# Patient Record
Sex: Female | Born: 1985 | Hispanic: Yes | Marital: Single | Smoking: Never smoker
Health system: Southern US, Community
[De-identification: ages and names within clinical notes are randomized; demographics above are authoritative.]

---

## 2018-11-15 ENCOUNTER — Other Ambulatory Visit: Payer: Self-pay | Admitting: Internal Medicine

## 2018-11-15 DIAGNOSIS — Z20822 Contact with and (suspected) exposure to covid-19: Secondary | ICD-10-CM

## 2018-11-16 LAB — NOVEL CORONAVIRUS, NAA: SARS-CoV-2, NAA: DETECTED — AB

## 2019-01-29 ENCOUNTER — Other Ambulatory Visit: Payer: Self-pay | Admitting: *Deleted

## 2019-01-29 DIAGNOSIS — Z20822 Contact with and (suspected) exposure to covid-19: Secondary | ICD-10-CM

## 2019-01-30 LAB — NOVEL CORONAVIRUS, NAA: SARS-CoV-2, NAA: NOT DETECTED

## 2020-04-06 ENCOUNTER — Encounter: Payer: Self-pay | Admitting: Emergency Medicine

## 2020-04-06 ENCOUNTER — Other Ambulatory Visit: Payer: Self-pay

## 2020-04-06 ENCOUNTER — Emergency Department: Payer: BC Managed Care – PPO

## 2020-04-06 ENCOUNTER — Emergency Department
Admission: EM | Admit: 2020-04-06 | Discharge: 2020-04-06 | Disposition: A | Discharge status: Home / Self Care | Payer: BC Managed Care – PPO | Attending: Emergency Medicine | Admitting: Emergency Medicine

## 2020-04-06 DIAGNOSIS — R197 Diarrhea, unspecified: Secondary | ICD-10-CM | POA: Insufficient documentation

## 2020-04-06 DIAGNOSIS — R1011 Right upper quadrant pain: Secondary | ICD-10-CM | POA: Diagnosis present

## 2020-04-06 LAB — LIPASE, BLOOD: Lipase: 36 U/L (ref 11–51)

## 2020-04-06 LAB — COMPREHENSIVE METABOLIC PANEL
ALT: 16 U/L (ref 0–44)
AST: 19 U/L (ref 15–41)
Albumin: 4.1 g/dL (ref 3.5–5.0)
Alkaline Phosphatase: 61 U/L (ref 38–126)
Anion gap: 8 (ref 5–15)
BUN: 10 mg/dL (ref 6–20)
CO2: 24 mmol/L (ref 22–32)
Calcium: 9.1 mg/dL (ref 8.9–10.3)
Chloride: 104 mmol/L (ref 98–111)
Creatinine, Ser: 0.56 mg/dL (ref 0.44–1.00)
GFR, Estimated: >60 mL/min (ref >60)
Glucose, Bld: 98 mg/dL (ref 70–99)
Potassium: 4 mmol/L (ref 3.5–5.1)
Sodium: 136 mmol/L (ref 135–145)
Total Bilirubin: 0.4 mg/dL (ref 0.3–1.2)
Total Protein: 7.5 g/dL (ref 6.5–8.1)

## 2020-04-06 LAB — URINALYSIS, COMPLETE (UACMP) WITH MICROSCOPIC
Bacteria, UA: NONE SEEN
Bilirubin Urine: NEGATIVE
Glucose, UA: NEGATIVE mg/dL
Ketones, ur: NEGATIVE mg/dL
Leukocytes,Ua: NEGATIVE
Nitrite: NEGATIVE
Protein, ur: NEGATIVE mg/dL
Specific Gravity, Urine: 1.026 (ref 1.005–1.030)
pH: 6 (ref 5.0–8.0)

## 2020-04-06 LAB — CBC
HCT: 37.8 % (ref 36.0–46.0)
Hemoglobin: 12.4 g/dL (ref 12.0–15.0)
MCH: 28.6 pg (ref 26.0–34.0)
MCHC: 32.8 g/dL (ref 30.0–36.0)
MCV: 87.1 fL (ref 80.0–100.0)
Platelets: 333 10*3/uL (ref 150–400)
RBC: 4.34 MIL/uL (ref 3.87–5.11)
RDW: 13.2 % (ref 11.5–15.5)
WBC: 7.2 10*3/uL (ref 4.0–10.5)
nRBC: 0 % (ref 0.0–0.2)

## 2020-04-06 LAB — POC URINE PREG, ED: Preg Test, Ur: NEGATIVE

## 2020-04-06 MED ORDER — PANTOPRAZOLE SODIUM 40 MG PO TBEC
40.0000 mg | DELAYED_RELEASE_TABLET | Freq: Once | ORAL | Status: AC
  Administered 2020-04-06: 40 mg via ORAL
  Filled 2020-04-06: qty 1

## 2020-04-06 MED ORDER — OXYCODONE-ACETAMINOPHEN 5-325 MG PO TABS
2.0000 | ORAL_TABLET | Freq: Once | ORAL | Status: AC
  Administered 2020-04-06: 2 via ORAL
  Filled 2020-04-06: qty 2

## 2020-04-06 MED ORDER — KETOROLAC TROMETHAMINE 30 MG/ML IJ SOLN
30.0000 mg | Freq: Once | INTRAMUSCULAR | Status: AC
  Administered 2020-04-06: 30 mg via INTRAVENOUS
  Filled 2020-04-06: qty 1

## 2020-04-06 MED ORDER — ONDANSETRON 4 MG PO TBDP
4.0000 mg | ORAL_TABLET | Freq: Once | ORAL | Status: AC
  Administered 2020-04-06: 4 mg via ORAL
  Filled 2020-04-06: qty 1

## 2020-04-06 MED ORDER — ALUM & MAG HYDROXIDE-SIMETH 200-200-20 MG/5ML PO SUSP
30.0000 mL | Freq: Once | ORAL | Status: AC
  Administered 2020-04-06: 30 mL via ORAL
  Filled 2020-04-06: qty 30

## 2020-04-06 MED ORDER — PANTOPRAZOLE SODIUM 40 MG PO TBEC: 40.0000 mg | DELAYED_RELEASE_TABLET | Freq: Every day | ORAL | 0 refills | Status: DC

## 2020-04-06 MED ORDER — HYDROMORPHONE HCL 1 MG/ML IJ SOLN
1.0000 mg | Freq: Once | INTRAMUSCULAR | Status: AC
  Administered 2020-04-06: 1 mg via INTRAVENOUS
  Filled 2020-04-06: qty 1

## 2020-04-06 MED ORDER — SUCRALFATE 1 G PO TABS
1.0000 g | ORAL_TABLET | Freq: Once | ORAL | Status: AC
  Administered 2020-04-06: 1 g via ORAL
  Filled 2020-04-06: qty 1

## 2020-04-06 NOTE — ED Notes (Signed)
[place] at bedside. Will administer pain medication when done

## 2020-04-06 NOTE — ED Provider Notes (Signed)
[facility] Emergency Department Provider Note  ____________________________________________   Event Date/Time   First MD Initiated Contact with Patient 04/06/20 1544     (approximate)  I have reviewed the triage vital signs and the nursing notes.   HISTORY  Chief Complaint Abdominal Pain and Flank Pain   HPI Charlotte Reynolds is a 35 y.o. female without significant past medical history who presents for assessment approximately 3 to 4 days of worsening right upper quadrant abdominal pain associate with some nonbloody diarrhea over the last 1 to 2 days.  She states the patient initially came and went but now seems more constant.  It does seem to get slightly worse with food she states she is 9 out of 10 in intensity pain at the moment.  She denies any headache, earache, sore throat, fevers, chills, nausea, vomiting, chest pain, cough, shortness of breath, left-sided or lower abdominal pain, urinary symptoms, blood in her stool, rash or extremity pain.  No recent falls or injuries.  She denies any significant NSAID use or EtOH use.  No prior similar episodes.  No clearly alleviating or aggravating factors.         History reviewed. No pertinent past medical history.  There are no problems to display for this patient.   History reviewed. No pertinent surgical history.  Prior to Admission medications   Medication Sig Start Date End Date Taking? Authorizing Provider  pantoprazole (PROTONIX) 40 MG tablet Take 1 tablet (40 mg total) by mouth daily for 15 days. 04/06/20 04/21/20 Yes Gilles Chiquito, MD    Allergies Sulfa antibiotics  No family history on file.  Social History Social History   Tobacco Use  . Smoking status: Never Smoker  . Smokeless tobacco: Never Used    Review of Systems  Review of Systems  Constitutional: Negative for chills and fever.  HENT: Negative for sore throat.   Eyes: Negative for pain.  Respiratory: Negative for  cough and stridor.   Cardiovascular: Negative for chest pain.  Gastrointestinal: Positive for abdominal pain and diarrhea. Negative for vomiting.  Skin: Negative for rash.  Neurological: Negative for seizures, loss of consciousness and headaches.  Psychiatric/Behavioral: Negative for suicidal ideas.  All other systems reviewed and are negative.     ____________________________________________   PHYSICAL EXAM:  VITAL SIGNS: ED Triage Vitals  Enc Vitals Group     BP 04/06/20 1445 111/72     Pulse Rate 04/06/20 1445 72     Resp 04/06/20 1445 18     Temp 04/06/20 1445 98.4 F (36.9 C)     Temp Source 04/06/20 1445 Oral     SpO2 04/06/20 1445 100 %     Weight 04/06/20 1443 150 lb (68 kg)     Height --      Head Circumference --      Peak Flow --      Pain Score 04/06/20 1442 9     Pain Loc --      Pain Edu? --      Excl. in GC? --    Vitals:   04/06/20 1445 04/06/20 1823  BP: 111/72 120/74  Pulse: 72 75  Resp: 18 16  Temp: 98.4 F (36.9 C)   SpO2: 100% 100%   Physical Exam Vitals and nursing note reviewed.  Constitutional:      General: She is not in acute distress.    Appearance: She is well-developed and well-nourished.  HENT:     Head: Normocephalic  and atraumatic.     Right Ear: External ear normal.     Left Ear: External ear normal.     Nose: Nose normal.  Eyes:     Conjunctiva/sclera: Conjunctivae normal.  Cardiovascular:     Rate and Rhythm: Normal rate and regular rhythm.     Heart sounds: No murmur heard.   Pulmonary:     Effort: Pulmonary effort is normal. No respiratory distress.     Breath sounds: Normal breath sounds.  Abdominal:     Palpations: Abdomen is soft.     Tenderness: There is abdominal tenderness in the right upper quadrant. There is no right CVA tenderness or left CVA tenderness.  Musculoskeletal:        General: No edema.     Cervical back: Neck supple.  Skin:    General: Skin is warm and dry.     Capillary Refill: Capillary  refill takes less than 2 seconds.  Neurological:     Mental Status: She is alert and oriented to person, place, and time.  Psychiatric:        Mood and Affect: Mood and affect and mood normal.     No rash or overlying skin changes over the patient's right upper quadrant. ____________________________________________   LABS (all labs ordered are listed, but only abnormal results are displayed)  Labs Reviewed  URINALYSIS, COMPLETE (UACMP) WITH MICROSCOPIC - Abnormal; Notable for the following components:      Result Value   Color, Urine YELLOW (*)    APPearance HAZY (*)    Hgb urine dipstick MODERATE (*)    All other components within normal limits  LIPASE, BLOOD  COMPREHENSIVE METABOLIC PANEL  CBC  POC URINE PREG, ED   ____________________________________________  EKG  Sinus rhythm with a ventricular rate of 67, normal axis, unremarkable intervals, no clear evidence of acute ischemia or significant underlying arrhythmia. ____________________________________________  RADIOLOGY  ED MD interpretation: Right upper quadrant ultrasound shows no evidence of cholecystitis, gallstone, or other clear acute process.  CT abdomen pelvis shows no evidence of diverticulitis, appendicitis, kidney stone, perinephric stranding, pancreatitis or any other clear acute abdominal pelvic pathology.  Official radiology report(s): CT ABDOMEN PELVIS WO CONTRAST  Result Date: 04/06/2020 CLINICAL DATA:  Lambert Mody right upper quadrant pain EXAM: CT ABDOMEN AND PELVIS WITHOUT CONTRAST TECHNIQUE: Multidetector CT imaging of the abdomen and pelvis was performed following the standard protocol without IV contrast. COMPARISON:  Ultrasound 04/06/2020 FINDINGS: Lower chest: No acute abnormality. Hepatobiliary: No focal liver abnormality is seen. No gallstones, gallbladder wall thickening, or biliary dilatation. Pancreas: Unremarkable. No pancreatic ductal dilatation or surrounding inflammatory changes. Spleen: Normal in  size without focal abnormality. Adrenals/Urinary Tract: Adrenal glands are unremarkable. Kidneys are normal, without renal calculi, focal lesion, or hydronephrosis. Bladder is unremarkable. Stomach/Bowel: Stomach is within normal limits. Appendix appears normal. No evidence of bowel wall thickening, distention, or inflammatory changes. Vascular/Lymphatic: No significant vascular findings are present. No enlarged abdominal or pelvic lymph nodes. Reproductive: IUD in the uterus.  No adnexal mass Other: No abdominal wall hernia or abnormality. No abdominopelvic ascites. Musculoskeletal: No acute or significant osseous findings. IMPRESSION: Negative. No CT evidence for acute intra-abdominal or pelvic abnormality. Electronically Signed   By: Jasmine Pang M.D.   On: 04/06/2020 18:52   US ABDOMEN LIMITED RUQ (LIVER/GB)  Result Date: 04/06/2020 CLINICAL DATA:  Right upper quadrant abdominal pain which radiates to the flank. EXAM: ULTRASOUND ABDOMEN LIMITED RIGHT UPPER QUADRANT COMPARISON:  None. FINDINGS: Gallbladder: No gallstones or wall  thickening visualized. A sonographic Eulah Pont sign is noted by sonographer. Common bile duct: Diameter: 2 mm Liver: A cyst in the right hepatic lobe measures 1.3 x 0.9 x 1.3 cm. Within normal limits in parenchymal echogenicity. Portal vein is patent on color Doppler imaging with normal direction of blood flow towards the liver. Other: None. IMPRESSION: A sonographic Eulah Pont sign is reported by the sonographer, however the gallbladder and liver are normal in appearance. Electronically Signed   By: Romona Curls M.D.   On: 04/06/2020 17:25    ____________________________________________   PROCEDURES  Procedure(s) performed (including Critical Care):  Procedures   ____________________________________________   INITIAL IMPRESSION / ASSESSMENT AND PLAN / ED COURSE      Patient presents with above to history exam for assessment of several days of right upper quadrant and  right flank pain that seems to be getting worse specially after eating any food.  Primary differential includes but is not limited to atypical presentation of ACS, cholecystitis, symptomatic cholelithiasis, pancreatitis, diverticulitis, kidney stone, pyelonephritis, PUD versus duodenal ulcer or duodenitis.  ECG shows no evidence of ischemia and overall very low suspicion for acute ACS.  Right upper quadrant ultrasound shows no evidence of gallstones or acute cholecystitis.  Lipase 36 not consistent with acute pancreatitis.  CMP shows no significant electrolyte or metabolic derangements.  No evidence of hepatitis or cholestasis.  CBC shows no leukocytosis or acute anemia.  UA shows some hemoglobin but no evidence of infection.  Urine pregnancy test is negative.  Ultrasound obtained shows no evidence of cholecystitis or any other clear acute right upper quadrant pathology.  CT abdomen pelvis shows no evidence of pyelonephritis, diverticulitis, kidney stone, or any other clear acute abdominal or pelvic pathology.  Given concern for possible PUD versus duodenal ulcer patient was given Protonix appropriate and Maalox as she had minimal change in her discomfort with opioids and Toradol.  Given stable vitals with otherwise reassuring exam and work-up I do believe she is safe for discharge plan for continued outpatient evaluation.  Discharge stable condition.  Strict precautions advised and discussed.  Rx written for Protonix.  Advise close outpatient PCP follow-up.     ____________________________________________   FINAL CLINICAL IMPRESSION(S) / ED DIAGNOSES  Final diagnoses:  RUQ pain  Right upper quadrant abdominal pain    Medications  alum & mag hydroxide-simeth (MAALOX/MYLANTA) 200-200-20 MG/5ML suspension 30 mL (has no administration in time range)  sucralfate (CARAFATE) tablet 1 g (has no administration in time range)  pantoprazole (PROTONIX) EC tablet 40 mg (has no administration in time  range)  oxyCODONE-acetaminophen (PERCOCET/ROXICET) 5-325 MG per tablet 2 tablet (2 tablets Oral Given 04/06/20 1651)  HYDROmorphone (DILAUDID) injection 1 mg (1 mg Intravenous Given 04/06/20 1818)  ketorolac (TORADOL) 30 MG/ML injection 30 mg (30 mg Intravenous Given 04/06/20 1818)  ondansetron (ZOFRAN-ODT) disintegrating tablet 4 mg (4 mg Oral Given 04/06/20 1909)     ED Discharge Orders         Ordered    pantoprazole (PROTONIX) 40 MG tablet  Daily        04/06/20 1919           Note:  This document was prepared using Dragon voice recognition software and may include unintentional dictation errors.   Gilles Chiquito, MD 04/06/20 [phone]

## 2020-04-06 NOTE — ED Triage Notes (Signed)
Pt in with sharp RUQ pain that radiates to flank x 4 days. States the pain gets worse after eating any food. Denies any cp, sob or n/v. Denies any urinary symptoms, fevers or chills. Still has gallbladder and appendix

## 2020-04-11 ENCOUNTER — Emergency Department
Admission: EM | Admit: 2020-04-11 | Discharge: 2020-04-11 | Disposition: A | Discharge status: Home / Self Care | Payer: BC Managed Care – PPO | Attending: Emergency Medicine | Admitting: Emergency Medicine

## 2020-04-11 ENCOUNTER — Emergency Department: Payer: BC Managed Care – PPO

## 2020-04-11 ENCOUNTER — Other Ambulatory Visit: Payer: Self-pay

## 2020-04-11 DIAGNOSIS — R109 Unspecified abdominal pain: Secondary | ICD-10-CM

## 2020-04-11 DIAGNOSIS — R1031 Right lower quadrant pain: Secondary | ICD-10-CM | POA: Diagnosis present

## 2020-04-11 DIAGNOSIS — N390 Urinary tract infection, site not specified: Secondary | ICD-10-CM | POA: Diagnosis not present

## 2020-04-11 LAB — HEPATIC FUNCTION PANEL
ALT: 20 U/L (ref 0–44)
AST: 29 U/L (ref 15–41)
Albumin: 4.4 g/dL (ref 3.5–5.0)
Alkaline Phosphatase: 54 U/L (ref 38–126)
Bilirubin, Direct: 0.1 mg/dL (ref 0.0–0.2)
Indirect Bilirubin: 0.9 mg/dL (ref 0.3–0.9)
Total Bilirubin: 1 mg/dL (ref 0.3–1.2)
Total Protein: 7.9 g/dL (ref 6.5–8.1)

## 2020-04-11 LAB — URINALYSIS, ROUTINE W REFLEX MICROSCOPIC
Bilirubin Urine: NEGATIVE
Glucose, UA: NEGATIVE mg/dL
Ketones, ur: NEGATIVE mg/dL
Nitrite: NEGATIVE
Protein, ur: 100 mg/dL — AB
Specific Gravity, Urine: 1.015 (ref 1.005–1.030)
Squamous Epithelial / HPF: >50 — ABNORMAL HIGH (ref 0–5)
pH: 5 (ref 5.0–8.0)

## 2020-04-11 LAB — BASIC METABOLIC PANEL
Anion gap: 9 (ref 5–15)
BUN: 7 mg/dL (ref 6–20)
CO2: 24 mmol/L (ref 22–32)
Calcium: 9 mg/dL (ref 8.9–10.3)
Chloride: 104 mmol/L (ref 98–111)
Creatinine, Ser: 0.85 mg/dL (ref 0.44–1.00)
GFR, Estimated: >60 mL/min (ref >60)
Glucose, Bld: 98 mg/dL (ref 70–99)
Potassium: 3.6 mmol/L (ref 3.5–5.1)
Sodium: 137 mmol/L (ref 135–145)

## 2020-04-11 LAB — CBC
HCT: 42.5 % (ref 36.0–46.0)
Hemoglobin: 14.2 g/dL (ref 12.0–15.0)
MCH: 29.2 pg (ref 26.0–34.0)
MCHC: 33.4 g/dL (ref 30.0–36.0)
MCV: 87.3 fL (ref 80.0–100.0)
Platelets: 356 10*3/uL (ref 150–400)
RBC: 4.87 MIL/uL (ref 3.87–5.11)
RDW: 13.1 % (ref 11.5–15.5)
WBC: 10 10*3/uL (ref 4.0–10.5)
nRBC: 0 % (ref 0.0–0.2)

## 2020-04-11 LAB — LIPASE, BLOOD: Lipase: 38 U/L (ref 11–51)

## 2020-04-11 LAB — PREGNANCY, URINE: Preg Test, Ur: NEGATIVE

## 2020-04-11 MED ORDER — HYDROCODONE-ACETAMINOPHEN 5-325 MG PO TABS: 2.0000 | ORAL_TABLET | Freq: Four times a day (QID) | ORAL | 0 refills | Status: DC | PRN

## 2020-04-11 MED ORDER — ONDANSETRON 4 MG PO TBDP: ORAL_TABLET | ORAL | 0 refills | Status: DC

## 2020-04-11 MED ORDER — KETOROLAC TROMETHAMINE 30 MG/ML IJ SOLN
15.0000 mg | Freq: Once | INTRAMUSCULAR | Status: AC
  Administered 2020-04-11: 15 mg via INTRAVENOUS
  Filled 2020-04-11: qty 1

## 2020-04-11 MED ORDER — SODIUM CHLORIDE 0.9 % IV SOLN
1.0000 g | INTRAVENOUS | Status: AC
  Administered 2020-04-11: 1 g via INTRAVENOUS
  Filled 2020-04-11: qty 10

## 2020-04-11 MED ORDER — DROPERIDOL 2.5 MG/ML IJ SOLN
2.5000 mg | Freq: Once | INTRAMUSCULAR | Status: AC
  Administered 2020-04-11: 2.5 mg via INTRAVENOUS
  Filled 2020-04-11: qty 2

## 2020-04-11 MED ORDER — CEPHALEXIN 500 MG PO CAPS: 500.0000 mg | ORAL_CAPSULE | Freq: Four times a day (QID) | ORAL | 0 refills | Status: AC

## 2020-04-11 MED ORDER — IOHEXOL 300 MG/ML  SOLN
100.0000 mL | Freq: Once | INTRAMUSCULAR | Status: AC | PRN
  Administered 2020-04-11: 100 mL via INTRAVENOUS

## 2020-04-11 NOTE — ED Notes (Signed)
Patient able to urinate. Ambulated to toilet independently.  Patient back in stretcher, appears to be more comfortable. Patient has stopped crying and rates pain lower than before. Patient educated on side effects of medication given, such as sleepiness. Patient and daughter state understanding.

## 2020-04-11 NOTE — ED Notes (Signed)
ED Provider at bedside. 

## 2020-04-11 NOTE — ED Notes (Signed)
Charlotte Reynolds daughter- [phone]

## 2020-04-11 NOTE — Discharge Instructions (Addendum)
As we discussed, your work-up was reassuring tonight, but one thing that has changed from before was that it does appear you have a urinary tract infection.  We treated you with ceftriaxone 1 g IV (IV antibiotics) and encourage you to take the full course of antibiotics prescribed.  Please also continue to take the medication prescribed previously by Dr. Katrinka Blazing on your last emergency department visit.  We provided information with how to follow-up with Dr. Norma Fredrickson who is a GI specialist that may be able to further evaluate your pain since it is not completely consistent with a urinary tract infection.  Use over-the-counter pain medicine as needed. Take Norco as prescribed for severe pain. Do not drink alcohol, drive or participate in any other potentially dangerous activities while taking this medication as it may make you sleepy. Do not take this medication with any other sedating medications, either prescription or over-the-counter. If you were prescribed Percocet or Vicodin, do not take these with acetaminophen (Tylenol) as it is already contained within these medications.   This medication is an opiate (or narcotic) pain medication and can be habit forming.  Use it as little as possible to achieve adequate pain control.  Do not use or use it with extreme caution if you have a history of opiate abuse or dependence.  If you are on a pain contract with your primary care doctor or a pain specialist, be sure to let them know you were prescribed this medication today from the [facility].  This medication is intended for your use only - do not give any to anyone else and keep it in a secure place where nobody else, especially children, have access to it.  It will also cause or worsen constipation, so you may want to consider taking an over-the-counter stool softener while you are taking this medication.    Return to the emergency department if you develop new or worsening symptoms that  concern you.

## 2020-04-11 NOTE — ED Triage Notes (Signed)
Pt presents with daughter, pt crying, appears in distress. Daughter states pt with severe right sided abd pain, has been seen here for same this week "and they didn't do anything for her". Daughter states has also seen urgent care for same without diagnosis. Pt clutching side, cyring.

## 2020-04-11 NOTE — ED Provider Notes (Signed)
[facility] Emergency Department Provider Note  ____________________________________________   Event Date/Time   First MD Initiated Contact with Patient 04/11/20 [phone]     (approximate)  I have reviewed the triage vital signs and the nursing notes.   HISTORY  Chief Complaint Abdominal Pain    HPI Charlotte Reynolds is a 35 y.o. female with no chronic medical issues who presents for evaluation of acute onset and severe sharp stabbing pain in the right side of her abdomen.  She was seen in this emergency department about 5 days ago for similar pain and had a reassuring examination with no specific cause for her symptoms.  She has also been seen at least once in urgent care including earlier today.  She is crying and cannot find a position of comfort.  She says that she cannot live like this and the pain has been ongoing since she left the hospital 5 days ago.  She said that none of the medications she was given helped and in fact nothing in particular makes the symptoms better.  She said that eating seems to make the symptoms worse.  The pain was up a little bit higher before but now it is in the mid to lower part of the right side of her abdomen and in her right flank.  She has had no associated nausea nor vomiting, and has had no dysuria nor hematuria.  She has had no recent traumas, falls, motor vehicle collisions, etc.  No numbness or tingling in her extremities.  She has no history of gallstones nor kidney stones.  She denies fever/chills, sore throat, chest pain, shortness of breath.         History reviewed. No pertinent past medical history.  There are no problems to display for this patient.   History reviewed. No pertinent surgical history.  Prior to Admission medications   Medication Sig Start Date End Date Taking? Authorizing Provider  cephALEXin (KEFLEX) 500 MG capsule Take 1 capsule (500 mg total) by mouth 4 (four) times daily for 12  days. 04/11/20 04/23/20 Yes Loleta RoseForbach, Dailyn Reith, MD  HYDROcodone-acetaminophen (NORCO/VICODIN) 5-325 MG tablet Take 2 tablets by mouth every 6 (six) hours as needed for moderate pain or severe pain. 04/11/20  Yes Loleta RoseForbach, Mita Vallo, MD  ondansetron (ZOFRAN ODT) 4 MG disintegrating tablet Allow 1-2 tablets to dissolve in your mouth every 8 hours as needed for nausea/vomiting 04/11/20  Yes Loleta RoseForbach, Nicholi Ghuman, MD  amitriptyline (ELAVIL) 25 MG tablet Take 25 mg by mouth at bedtime. 04/08/20   [provider]  dicyclomine (BENTYL) 20 MG tablet Take 20 mg by mouth 4 (four) times daily as needed for pain. 04/08/20   [provider]  pantoprazole (PROTONIX) 40 MG tablet Take 1 tablet (40 mg total) by mouth daily for 15 days. 04/06/20 04/21/20  Gilles ChiquitoSmith, Zachary P, MD    Allergies Sulfa antibiotics  History reviewed. No pertinent family history.  Social History Social History   Tobacco Use  . Smoking status: Never Smoker  . Smokeless tobacco: Never Used    Review of Systems Constitutional: No fever/chills Eyes: No visual changes. ENT: No sore throat. Cardiovascular: Denies chest pain. Respiratory: Denies shortness of breath. Gastrointestinal: Severe right-sided abdominal and flank pain as described above.  No nausea nor vomiting. Genitourinary: Negative for dysuria. Musculoskeletal: Negative for neck pain.  Negative for back pain. Integumentary: Negative for rash. Neurological: Negative for headaches, focal weakness or numbness.   ____________________________________________   PHYSICAL EXAM:  VITAL SIGNS: ED  Triage Vitals  Enc Vitals Group     BP 04/11/20 0029 (!) 127/94     Pulse Rate 04/11/20 0029 90     Resp 04/11/20 0029 16     Temp 04/11/20 0035 98.4 F (36.9 C)     Temp Source 04/11/20 0035 Oral     SpO2 04/11/20 0029 100 %     Weight 04/11/20 0030 68 kg (149 lb 14.6 oz)     Height 04/11/20 0030 1.48 m (4' 10.27")     Head Circumference --      Peak Flow --      Pain Score  04/11/20 0030 10     Pain Loc --      Pain Edu? --      Excl. in GC? --     Constitutional: Alert and oriented.  Crying, appears to be in pain. Eyes: Conjunctivae are normal.  Head: Atraumatic. Nose: No congestion/rhinnorhea. Mouth/Throat: Patient is wearing a mask. Neck: No stridor.  No meningeal signs.   Cardiovascular: Normal rate, regular rhythm. Good peripheral circulation. Respiratory: Normal respiratory effort.  No retractions. Gastrointestinal: Soft and nondistended.  Tender to palpation throughout the right side of the abdomen as well as tenderness to percussion of the right flank.  Denies epigastric tenderness and right upper quadrant tenderness.  No pulsatile masses. Musculoskeletal: No lower extremity tenderness nor edema. No gross deformities of extremities. Neurologic:  Normal speech and language. No gross focal neurologic deficits are appreciated.  Skin:  Skin is warm, dry and intact. Psychiatric: Mood and affect are agitated and in pain, angry at no specific diagnosis but obviously hurting as well as being frustrated.  ____________________________________________   LABS (all labs ordered are listed, but only abnormal results are displayed)  Labs Reviewed  URINALYSIS, ROUTINE W REFLEX MICROSCOPIC - Abnormal; Notable for the following components:      Result Value   Color, Urine YELLOW (*)    APPearance TURBID (*)    Hgb urine dipstick LARGE (*)    Protein, ur 100 (*)    Leukocytes,Ua LARGE (*)    Bacteria, UA MANY (*)    Squamous Epithelial / LPF >50 (*)    All other components within normal limits  URINE CULTURE  BASIC METABOLIC PANEL  CBC  HEPATIC FUNCTION PANEL  LIPASE, BLOOD  PREGNANCY, URINE   ____________________________________________  EKG  No indication for emergent EKG tonight.  I reviewed her EKG from 5 days ago and it was reassuring including normal QTC interval. ____________________________________________  RADIOLOGY I, Loleta Rose,  personally viewed and evaluated these images (plain radiographs) as part of my medical decision making, as well as reviewing the written report by the radiologist.  ED MD interpretation:  No acute abnormalities identified on CT abd/pelvis.  Official radiology report(s): CT ABDOMEN PELVIS W CONTRAST  Result Date: 04/11/2020 CLINICAL DATA:  Severe right-sided abdominal pain, recently assessed for similar findings EXAM: CT ABDOMEN AND PELVIS WITH CONTRAST TECHNIQUE: Multidetector CT imaging of the abdomen and pelvis was performed using the standard protocol following bolus administration of intravenous contrast. CONTRAST:  OMNIPAQUE IOHEXOL 300 MG/ML  SOLN COMPARISON:  CT 04/06/2020 FINDINGS: Lower chest: Lung bases are clear. Normal heart size. No pericardial effusion. Hepatobiliary: Few scattered subcentimeter hypoattenuating foci throughout the liver too small to fully characterize on CT imaging but statistically likely benign. Slightly larger 1.3 cm fluid attenuation cyst seen along the posteroinferior right lobe liver (2/35). No concerning renal mass. Smooth liver surface contour. Normal liver attenuation. Normal  gallbladder and biliary tree without pericholecystic fluid, inflammation or biliary ductal dilatation. No visible calcified gallstones. Pancreas: No pancreatic ductal dilatation or surrounding inflammatory changes. Spleen: Normal in size. No concerning splenic lesions. Adrenals/Urinary Tract: Normal adrenals. Few subcentimeter hypodense foci in both kidneys are incompletely characterized on this CT examination. No focal concerning renal abnormalities. Kidneys enhance and excrete uniformly. No perinephric stranding or free fluid is seen. No concerning renal mass. No urolithiasis or hydronephrosis. Urinary bladder is unremarkable. Stomach/Bowel: Distal esophagus, stomach and duodenal sweep are unremarkable. No small bowel wall thickening or dilatation. No evidence of obstruction. A normal  appendix is visualized. No colonic dilatation or wall thickening. Vascular/Lymphatic: No significant vascular findings are present. No enlarged abdominal or pelvic lymph nodes. Reproductive: Retroflexed uterus. Grossly normal positioning of the radiopaque IUD. No concerning adnexal lesions. 2.1 cm probable corpus luteum in the right adnexa. No routine follow-up imaging recommended. Note: This recommendation does not apply to premenarchal patients and to those with increased risk (genetic, family history, elevated tumor markers or other high-risk factors) of ovarian cancer. Reference: JACR 2020 Feb; 17(2):248-254 Other: Trace low-attenuation free fluid in the deep pelvis. No free air. No bowel containing hernias. Musculoskeletal: No acute osseous abnormality or suspicious osseous lesion. IMPRESSION: 1. No acute or conspicuous intra-abdominal process to provide a cause for patient's symptoms. 2. Probable corpus luteum in the right adnexa, normal physiologic finding. 3. Retroflexed uterus with expected positioning of IUD. Electronically Signed   By: Kreg Shropshire M.D.   On: 04/11/2020 03:17    ____________________________________________   PROCEDURES   Procedure(s) performed (including Critical Care):  Procedures   ____________________________________________   INITIAL IMPRESSION / MDM / ASSESSMENT AND PLAN / ED COURSE  As part of my medical decision making, I reviewed the following data within the electronic MEDICAL RECORD NUMBER History obtained from family, Nursing notes reviewed and incorporated, Labs reviewed , Old chart reviewed, Notes from prior ED visits and [place] Controlled Substance Database   Differential diagnosis includes, but is not limited to, gastric/duodenal ulcer, renal/ureteral colic, biliary disease, pancreatitis, neoplasm, SBO/ileus.  Aortic dissection or aneurysm is also possible though I think less likely.  I reviewed the medical record and saw that the patient had normal labs  including LFTs and lipase, unremarkable right upper quadrant ultrasound, and normal CT abdomen/pelvis with IV contrast when she visited the emergency department 5 days ago.  Given the possibility of ulcers, she was given a prescription for Protonix and encouraged to follow closely with her PCP.  She received Toradol, Maalox, and opioids in the emergency department, but with minimal effect.  Her vital signs are stable.  The patient is obviously in pain and is crying, is also very angry and frustrated at her lack of diagnosis and says she cannot continue living in this amount of pain.  I reviewed the [place] controlled substance database and she has no controlled substance prescription within the last 2 years and she does not have frequent visits to the emergency department.  It is clear that something new or acute is happening but the etiology is unclear, especially given the reassuring and largely negative work-up from 5 days ago.  I explained that the current plan is to repeat her lab work and to try seeing if we can get her feeling better with medications.  I will repeat a urinalysis to see if there is any indication of increased hematuria.  Her pregnancy test was -5 days ago but we will repeat that as well.  Lab work is pending.  Both she and her adult daughter who is at bedside state that the pain medicine (specifically identifying Dilaudid and morphine which were given at her last ED visit) did not help, so I will provide Toradol 15 mg IV and droperidol 2.5 mg IV which I think will help with the pain as well as a calming agent.  I reviewed her prior EKG which showed no concerning QTC prolongation.  I will consider additional imaging or repeat imaging.  The nature of her pain, the association with eating, etc., suggests biliary colic, but she had a reassuring ultrasound previously and tonight her pain is much lower in the abdomen and more suggestive of ureteral colic.  She did have a small amount of  hemoglobin in her urine previously but with no visualized stones.       Clinical Course as of 04/11/20 0434  Sun Apr 11, 2020  0111 Basic metabolic panel Normal BMP [CF]  0154 Hepatic function panel Normal hepatic function panel [CF]  0155 Lipase, blood Normal lipase [CF]  0155 CBC Normal CBC including no leukocytosis no evidence of anemia [CF]  0237 Urinalysis, Routine w reflex microscopic(!) Urinalysis appears grossly positive.  This is a significant change from her urinalysis 5 days ago.  This could suggest pyelonephritis.  I think it is very unlikely she has developed an obstructive stone or obstructive uropathy in the 5 days but given the severity of her pain in her right side of the abdomen and flank as well as a grossly positive urinalysis, I am going to repeat her CT abdomen/pelvis with IV contrast to rule out obstructive uropathy as well as to further evaluate for possible pyelonephritis.  I am treating empirically with ceftriaxone 1 g IV and a urine culture has been sent as well.  The patient is much more comfortable now.  I updated her and her daughter and they understand and agree with the plan. [CF]  0330 CT ABDOMEN PELVIS W CONTRAST Generally reassuring CT scan.  No radiographic evidence of pyelonephritis.  Patient has a corpus luteum cyst by the radiologist was not concerned by the appearance nor the size.  No other acute abnormalities including of the biliary tree or small bowel. [CF]  0410 Patient feels much better than she did before. She says that the droperidol was the only thing that she has gone recently that seems to have helped.  I advised her of the reassuring CT results and she and her daughter are comfortable with the plan for outpatient follow-up with GI as well as her primary care doctor. We will treat her empirically for urinary tract infection/pyonephritis. She knows to take the medications and follow-up with GI as recommended. I gave my usual and customary return  precautions. [CF]    Clinical Course User Index [CF] Loleta Rose, MD     ____________________________________________  FINAL CLINICAL IMPRESSION(S) / ED DIAGNOSES  Final diagnoses:  Right sided abdominal pain  Right flank pain  Urinary tract infection with hematuria, site unspecified     MEDICATIONS GIVEN DURING THIS VISIT:  Medications  droperidol (INAPSINE) 2.5 MG/ML injection 2.5 mg (2.5 mg Intravenous Given 04/11/20 0102)  ketorolac (TORADOL) 30 MG/ML injection 15 mg (15 mg Intravenous Given 04/11/20 0102)  cefTRIAXone (ROCEPHIN) 1 g in sodium chloride 0.9 % 100 mL IVPB (1 g Intravenous New Bag/Given 04/11/20 0339)  iohexol (OMNIPAQUE) 300 MG/ML solution 100 mL (100 mLs Intravenous Contrast Given 04/11/20 0258)     ED Discharge Orders  Ordered    cephALEXin (KEFLEX) 500 MG capsule  4 times daily        04/11/20 0426    HYDROcodone-acetaminophen (NORCO/VICODIN) 5-325 MG tablet  Every 6 hours PRN        04/11/20 0426    ondansetron (ZOFRAN ODT) 4 MG disintegrating tablet        04/11/20 0426          *Please note:  Charlotte Reynolds was evaluated in Emergency Department on 04/11/2020 for the symptoms described in the history of present illness. She was evaluated in the context of the global COVID-19 pandemic, which necessitated consideration that the patient might be at risk for infection with the SARS-CoV-2 virus that causes COVID-19. Institutional protocols and algorithms that pertain to the evaluation of patients at risk for COVID-19 are in a state of rapid change based on information released by regulatory bodies including the CDC and federal and state organizations. These policies and algorithms were followed during the patient's care in the ED.  Some ED evaluations and interventions may be delayed as a result of limited staffing during and after the pandemic.*  Note:  This document was prepared using Dragon voice recognition software and may include  unintentional dictation errors.   Loleta Rose, MD 04/11/20 [phone]

## 2020-04-11 NOTE — ED Notes (Signed)
Patient transported to CT 

## 2020-04-11 NOTE — ED Notes (Signed)
Patient in stretcher, daughter bedside. Patient instructed to give urine sample when she goes to the bathroom.  Patient in obvious distress, guarding abdomen and crying.

## 2020-04-11 NOTE — ED Notes (Signed)
EDP bedside explaining medications to be administered during this visit. Patient became irate, yelling at EDP. Pt states "you are not going to help me, you just give me medication to go home and you aren't going to help me".  EDP explained that patient's behavior is inappropriate. Patient tearful and allowing EDP to continue assessment.

## 2020-04-11 NOTE — ED Notes (Signed)
Patient visualized resting. NAD noted, VSS.

## 2020-04-12 LAB — URINE CULTURE
Culture: <10000 — AB
Special Requests: NORMAL

## 2020-04-21 ENCOUNTER — Ambulatory Visit: Payer: BC Managed Care – PPO | Admitting: Physician Assistant

## 2022-08-12 IMAGING — CT CT ABD-PELV W/O CM
2 of 4 series · 17 of 46 positions shown, 19 images · non-contrast
Comparison: Ultrasound 04/06/2020

CLINICAL DATA: Sharp right upper quadrant pain

EXAM:
CT ABDOMEN AND PELVIS WITHOUT CONTRAST
TECHNIQUE: Multidetector CT imaging of the abdomen and pelvis was performed
following the standard protocol without IV contrast.

[Series 2: routine abd/pel wo · axial · 0.66mm/px · z∈[-707,-297]mm · 14 of 90 slices shown, 16 images]
[im 4/90  soft-tissue]
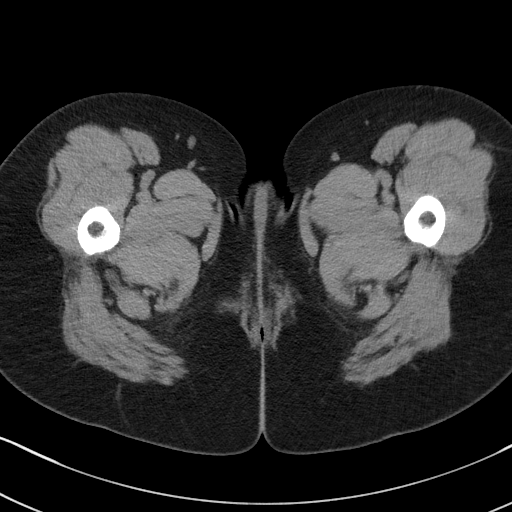
[im 4/90  bone]
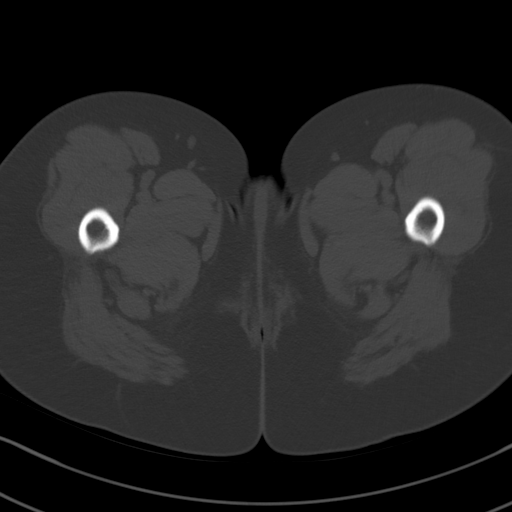
[im 12/90  soft-tissue]
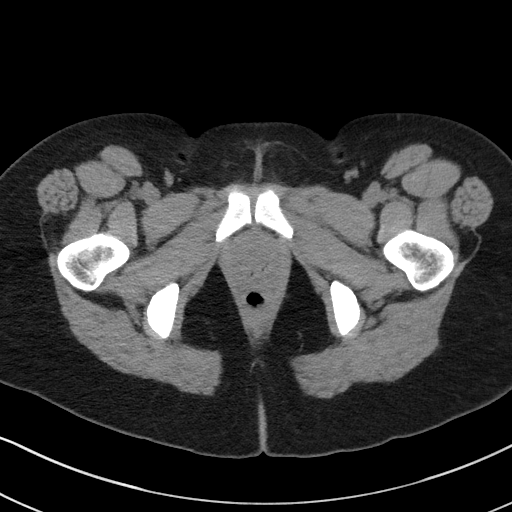
[im 19/90  soft-tissue]
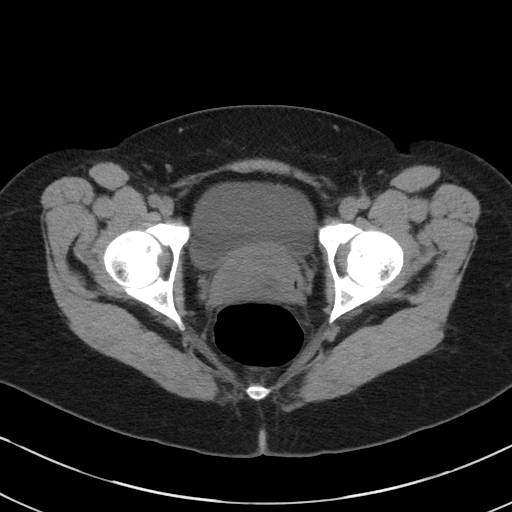
[im 23/90  soft-tissue]
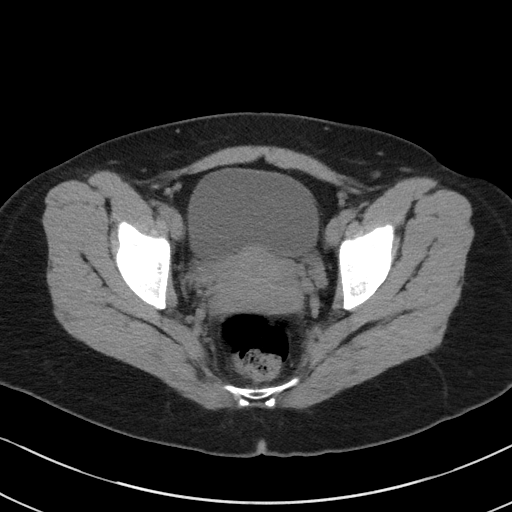
[im 30/90  soft-tissue]
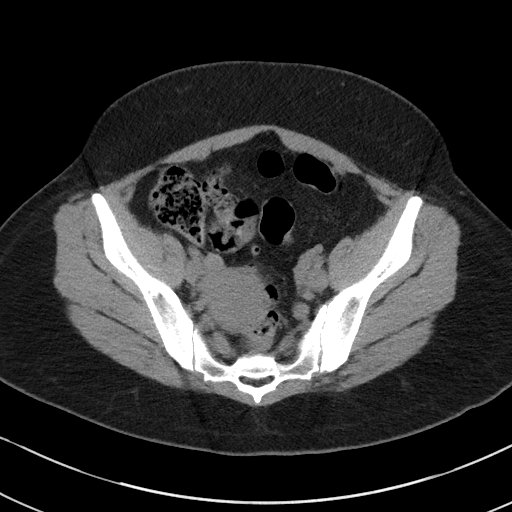
[im 38/90  soft-tissue]
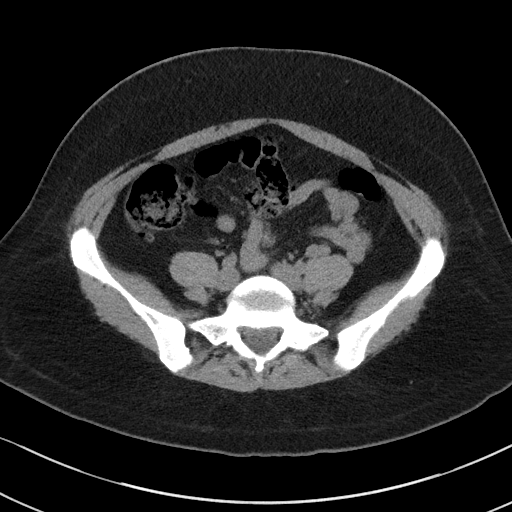
[im 41/90  soft-tissue]
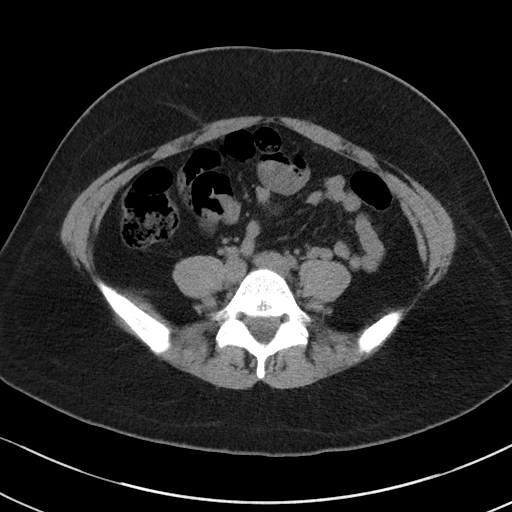
[im 49/90  soft-tissue]
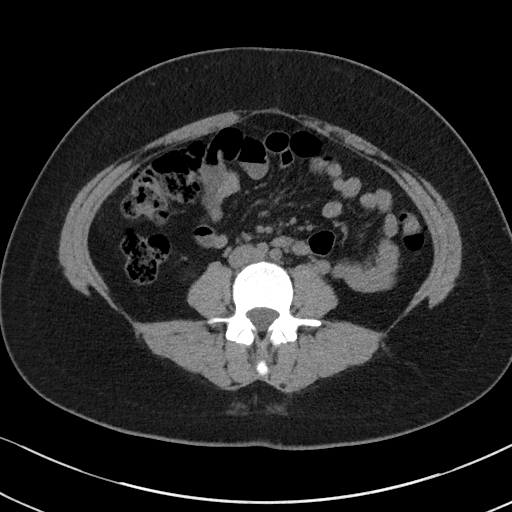
[im 52/90  soft-tissue]
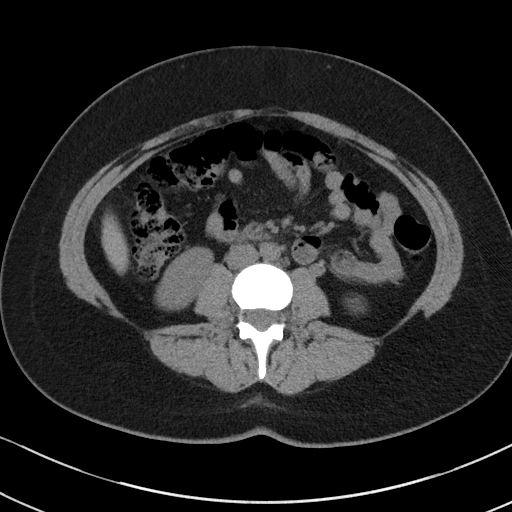
[im 52/90  bone]
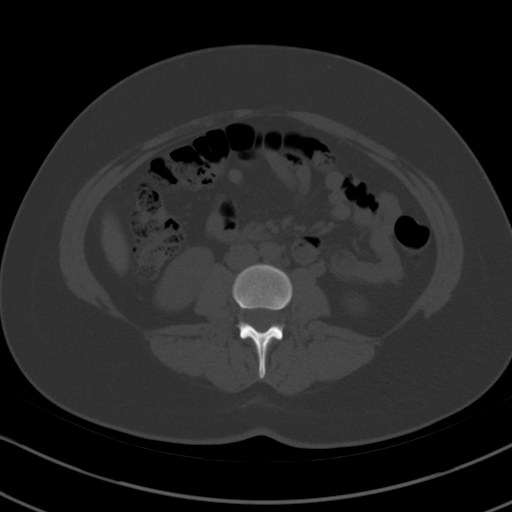
[im 60/90  soft-tissue]
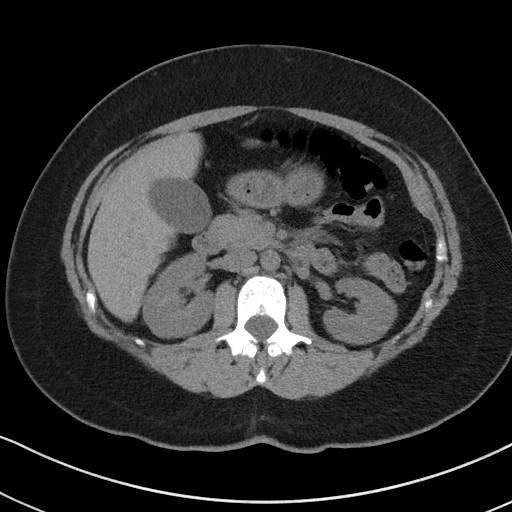
[im 67/90  soft-tissue]
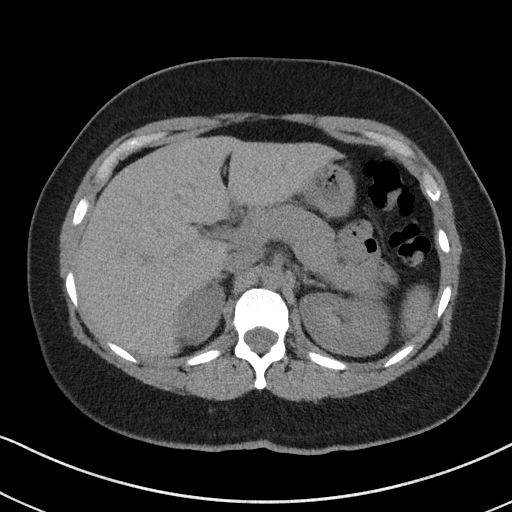
[im 71/90  soft-tissue]
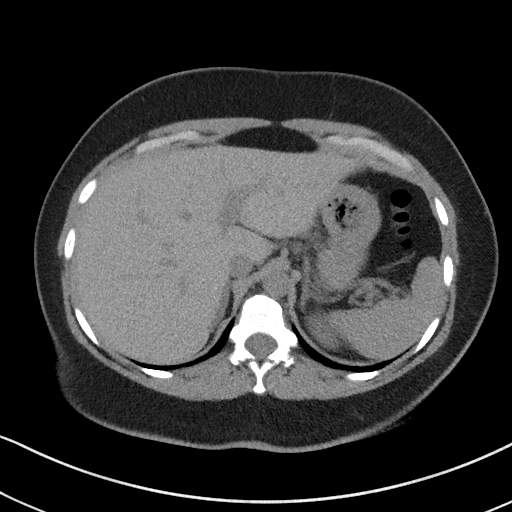
[im 78/90  soft-tissue]
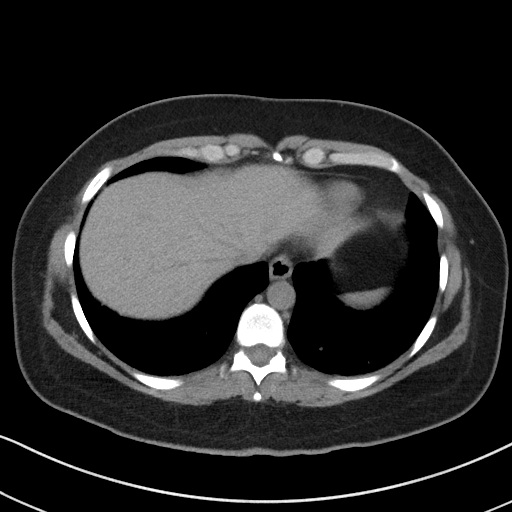
[im 86/90  soft-tissue]
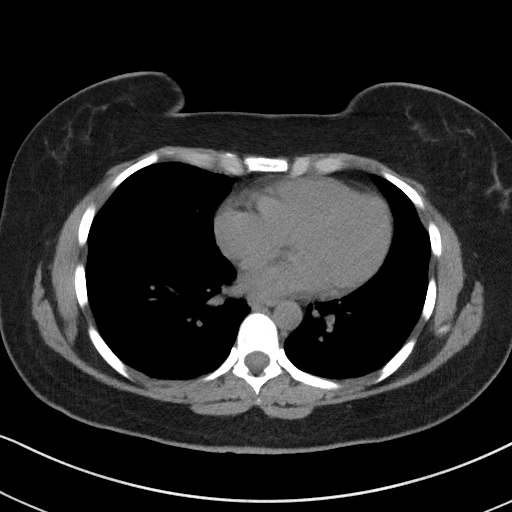

[Series 4: coronal st · coronal · 0.80mm/px · 3 of 93 slices shown]
[im 31/93  soft-tissue]
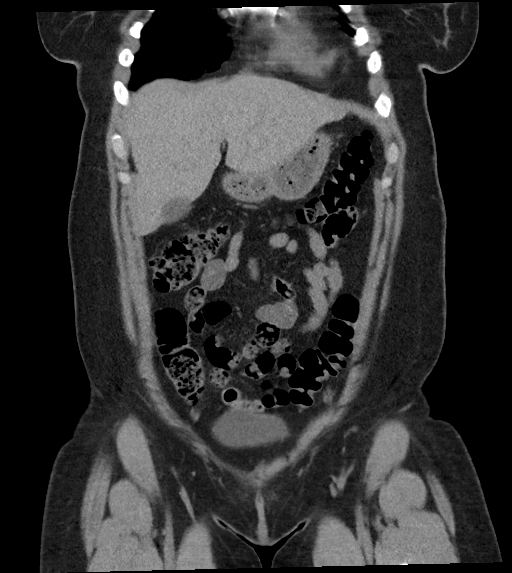
[im 41/93  soft-tissue]
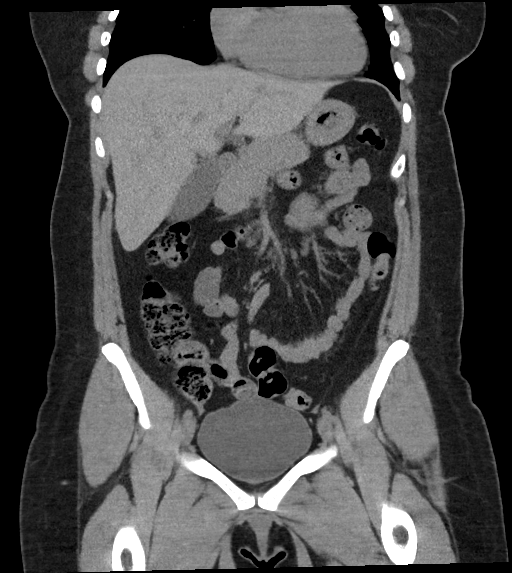
[im 52/93  soft-tissue]
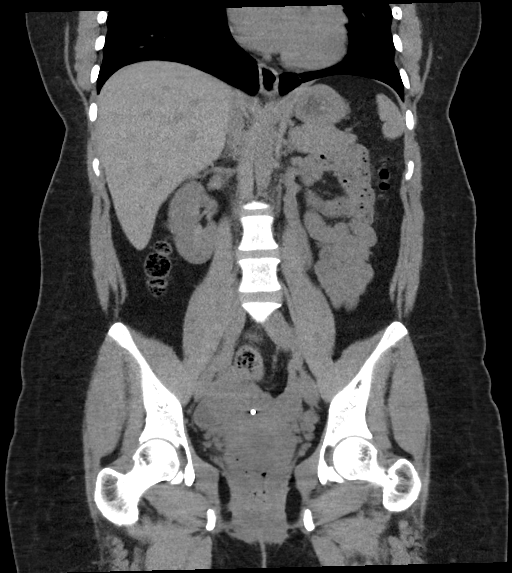

[17 of 46 positions shown; findings below may reference images not displayed]

FINDINGS: Lower chest: No acute abnormality.

Hepatobiliary: No focal liver abnormality is seen. No gallstones,
gallbladder wall thickening, or biliary dilatation.

Pancreas: Unremarkable. No pancreatic ductal dilatation or
surrounding inflammatory changes.

Spleen: Normal in size without focal abnormality.

Adrenals/Urinary Tract: Adrenal glands are unremarkable. Kidneys are
normal, without renal calculi, focal lesion, or hydronephrosis.
Bladder is unremarkable.

Stomach/Bowel: Stomach is within normal limits. Appendix appears
normal. No evidence of bowel wall thickening, distention, or
inflammatory changes.

Vascular/Lymphatic: No significant vascular findings are present. No
enlarged abdominal or pelvic lymph nodes.

Reproductive: IUD in the uterus.  No adnexal mass

Other: No abdominal wall hernia or abnormality. No abdominopelvic
ascites.

Musculoskeletal: No acute or significant osseous findings.
IMPRESSION: Negative. No CT evidence for acute intra-abdominal or pelvic
abnormality.

## 2022-11-17 ENCOUNTER — Emergency Department: Payer: 59

## 2022-11-17 ENCOUNTER — Emergency Department
Admission: EM | Admit: 2022-11-17 | Discharge: 2022-11-17 | Disposition: A | Discharge status: Home / Self Care | Payer: 59 | Attending: Emergency Medicine | Admitting: Emergency Medicine

## 2022-11-17 ENCOUNTER — Other Ambulatory Visit: Payer: Self-pay

## 2022-11-17 ENCOUNTER — Encounter: Payer: Self-pay | Admitting: Intensive Care

## 2022-11-17 DIAGNOSIS — R1031 Right lower quadrant pain: Secondary | ICD-10-CM | POA: Diagnosis present

## 2022-11-17 LAB — CBC
HCT: 38.7 % (ref 36.0–46.0)
Hemoglobin: 13 g/dL (ref 12.0–15.0)
MCH: 30 pg (ref 26.0–34.0)
MCHC: 33.6 g/dL (ref 30.0–36.0)
MCV: 89.4 fL (ref 80.0–100.0)
Platelets: 294 10*3/uL (ref 150–400)
RBC: 4.33 MIL/uL (ref 3.87–5.11)
RDW: 12.7 % (ref 11.5–15.5)
WBC: 9.8 10*3/uL (ref 4.0–10.5)
nRBC: 0 % (ref 0.0–0.2)

## 2022-11-17 LAB — COMPREHENSIVE METABOLIC PANEL
ALT: 11 U/L (ref 0–44)
AST: 14 U/L — ABNORMAL LOW (ref 15–41)
Albumin: 3.8 g/dL (ref 3.5–5.0)
Alkaline Phosphatase: 58 U/L (ref 38–126)
Anion gap: 7 (ref 5–15)
BUN: 9 mg/dL (ref 6–20)
CO2: 24 mmol/L (ref 22–32)
Calcium: 8.9 mg/dL (ref 8.9–10.3)
Chloride: 105 mmol/L (ref 98–111)
Creatinine, Ser: 0.64 mg/dL (ref 0.44–1.00)
GFR, Estimated: >60 mL/min (ref >60)
Glucose, Bld: 85 mg/dL (ref 70–99)
Potassium: 3.7 mmol/L (ref 3.5–5.1)
Sodium: 136 mmol/L (ref 135–145)
Total Bilirubin: 1 mg/dL (ref 0.3–1.2)
Total Protein: 7.1 g/dL (ref 6.5–8.1)

## 2022-11-17 LAB — URINALYSIS, ROUTINE W REFLEX MICROSCOPIC
Bilirubin Urine: NEGATIVE
Glucose, UA: NEGATIVE mg/dL
Ketones, ur: NEGATIVE mg/dL
Leukocytes,Ua: NEGATIVE
Nitrite: NEGATIVE
Protein, ur: NEGATIVE mg/dL
Specific Gravity, Urine: 1.009 (ref 1.005–1.030)
pH: 6 (ref 5.0–8.0)

## 2022-11-17 LAB — LIPASE, BLOOD: Lipase: 31 U/L (ref 11–51)

## 2022-11-17 LAB — POC URINE PREG, ED: Preg Test, Ur: NEGATIVE

## 2022-11-17 MED ORDER — HALOPERIDOL LACTATE 5 MG/ML IJ SOLN
2.5000 mg | Freq: Once | INTRAMUSCULAR | Status: AC
  Administered 2022-11-17: 2.5 mg via INTRAVENOUS
  Filled 2022-11-17: qty 1

## 2022-11-17 MED ORDER — IOHEXOL 300 MG/ML  SOLN
80.0000 mL | Freq: Once | INTRAMUSCULAR | Status: AC | PRN
  Administered 2022-11-17: 80 mL via INTRAVENOUS

## 2022-11-17 MED ORDER — KETOROLAC TROMETHAMINE 15 MG/ML IJ SOLN
15.0000 mg | Freq: Once | INTRAMUSCULAR | Status: AC
  Administered 2022-11-17: 15 mg via INTRAVENOUS
  Filled 2022-11-17: qty 1

## 2022-11-17 MED ORDER — DICYCLOMINE HCL 10 MG PO CAPS: 10.0000 mg | ORAL_CAPSULE | Freq: Three times a day (TID) | ORAL | 0 refills | Status: DC | PRN

## 2022-11-17 NOTE — ED Triage Notes (Signed)
From [facility].  Rlq pain since yesterday am when she was working out.  They did cbc and said wnl.  Says she was tender to palpation

## 2022-11-17 NOTE — Discharge Instructions (Addendum)
Please seek medical attention for any high fevers, chest pain, shortness of breath, change in behavior, persistent vomiting, bloody stool or any other new or concerning symptoms.  

## 2022-11-17 NOTE — ED Provider Notes (Signed)
[facility] Provider Note    Event Date/Time   First MD Initiated Contact with Patient 11/17/22 [phone]     (approximate)   History   Abdominal Pain   HPI Britteny Ima Hafner is a 37 y.o. female presenting today for right lower quadrant abdominal pain.  Patient states pain in her right lower quadrant started 2 days ago around the time she was working out.  She has had progressive worsening of the pain.  Notices it with bending over or twisting but it is also present at rest now.  Tenderness to palpation in that area.  Denies fever, chills, nausea, vomiting, diarrhea, constipation, dysuria, vaginal bleeding or discharge.  No prior abdominal surgeries.     Physical Exam   Triage Vital Signs: ED Triage Vitals  Encounter Vitals Group     BP 11/17/22 0950 109/73     Systolic BP Percentile --      Diastolic BP Percentile --      Pulse Rate 11/17/22 0950 85     Resp 11/17/22 0950 18     Temp 11/17/22 0950 98.8 F (37.1 C)     Temp Source 11/17/22 0950 Oral     SpO2 11/17/22 0950 98 %     Weight 11/17/22 0945 152 lb 3.2 oz (69 kg)     Height 11/17/22 0946 4\' 10"  (1.473 m)     Head Circumference --      Peak Flow --      Pain Score 11/17/22 0946 7     Pain Loc --      Pain Education --      Exclude from Growth Chart --     Most recent vital signs: Vitals:   11/17/22 1000 11/17/22 1708  BP: 111/72 108/67  Pulse: 73 87  Resp:  18  Temp:  98.3 F (36.8 C)  SpO2: 100% 100%   Physical Exam: I have reviewed the vital signs and nursing notes. General: Awake, alert, no acute distress.  Nontoxic appearing. Head:  Atraumatic, normocephalic.   ENT:  EOM intact, PERRL. Oral mucosa is pink and moist with no lesions. Neck: Neck is supple with full range of motion, No meningeal signs. Cardiovascular:  RRR, No murmurs. Peripheral pulses palpable and equal bilaterally. Respiratory:  Symmetrical chest wall expansion.  No rhonchi, rales, or wheezes.  Good  air movement throughout.  No use of accessory muscles.   Musculoskeletal:  No cyanosis or edema. Moving extremities with full ROM Abdomen:  Soft, tender to palpation right lower quadrant, nondistended. Neuro:  GCS 15, moving all four extremities, interacting appropriately. Speech clear. Psych:  Calm, appropriate.   Skin:  Warm, dry, no rash.    ED Results / Procedures / Treatments   Labs (all labs ordered are listed, but only abnormal results are displayed) Labs Reviewed  COMPREHENSIVE METABOLIC PANEL - Abnormal; Notable for the following components:      Result Value   AST 14 (*)    All other components within normal limits  URINALYSIS, ROUTINE W REFLEX MICROSCOPIC - Abnormal; Notable for the following components:   Color, Urine STRAW (*)    APPearance CLEAR (*)    Hgb urine dipstick MODERATE (*)    Bacteria, UA RARE (*)    All other components within normal limits  LIPASE, BLOOD  CBC  POC URINE PREG, ED     EKG    RADIOLOGY Independently interpreted CT abdomen/pelvis along with radiology read indicating no obvious appendicitis.  Slight  evidence of colitis around the cecum but no other acute pathology.  Transvaginal ultrasound imaging pending at time of signout   PROCEDURES:  Critical Care performed: No  Procedures   MEDICATIONS ORDERED IN ED: Medications  iohexol (OMNIPAQUE) 300 MG/ML solution 80 mL (80 mLs Intravenous Contrast Given 11/17/22 1056)  ketorolac (TORADOL) 15 MG/ML injection 15 mg (15 mg Intravenous Given 11/17/22 1159)  haloperidol lactate (HALDOL) injection 2.5 mg (2.5 mg Intravenous Given 11/17/22 1614)     IMPRESSION / MDM / ASSESSMENT AND PLAN / ED COURSE  I reviewed the triage vital signs and the nursing notes.                              Differential diagnosis includes, but is not limited to, appendicitis, ovarian cyst, ovarian torsion, nephrolithiasis, cystitis.  Patient's presentation is most consistent with acute complicated illness  / injury requiring diagnostic workup.  Patient is a 37 year old female presenting for 2 days of right lower quadrant abdominal pain.  Vital signs stable on arrival.  Initial concern for appendicitis.  Laboratory workup at this time overall reassuring with no leukocytosis, normal lipase, and unremarkable CMP.  UA shows no evidence of UTI.  CT abdomen/pelvis was ordered to evaluate for possible appendicitis.  No evidence of appendicitis but did show some colitis around the cecum.  Separately, noted follicles around the right ovary but unable to fully differentiate.  Follow-up transvaginal ultrasound was ordered to rule out ovarian etiology of pain symptoms.  Patient was given 1 dose of Toradol with improvement of pain symptoms.  Patient signed out to oncoming provider while awaiting results of transvaginal ultrasound.  Suspect if this is negative, then patient can be discharged with supportive care.  The patient is on the cardiac monitor to evaluate for evidence of arrhythmia and/or significant heart rate changes. Clinical Course as of 11/18/22 1110  Fri Nov 17, 2022  1042 Comprehensive metabolic panel(!) [DW]  1042 Preg Test, Ur: Negative Unremarkable [DW]  1043 Lipase: 31 [DW]  1110 Urinalysis, Routine w reflex microscopic -Urine, Clean Catch(!) No UTI but some evidence of blood which could possibly indicate stone.  Will evaluate CT abdomen/pelvis [DW]    Clinical Course User Index [DW] Janith Lima, MD     FINAL CLINICAL IMPRESSION(S) / ED DIAGNOSES   Final diagnoses:  Right lower quadrant abdominal pain     Rx / DC Orders   ED Discharge Orders          Ordered    dicyclomine (BENTYL) 10 MG capsule  3 times daily PRN        11/17/22 1723             Note:  This document was prepared using Dragon voice recognition software and may include unintentional dictation errors.   Janith Lima, MD 11/18/22 1110

## 2022-11-17 NOTE — ED Provider Notes (Signed)
[place] without clear etiology of the pain, does show simple cyst. Patient was given haldol given concern for some inflammation around the cecum. Patient did  have good relief after the pain medication. At this time given that patient felt improved will plan on discharging. Will give patient both GI and Ob/gyn follow up.   Phineas Semen, MD 11/17/22 [phone]

## 2023-01-08 ENCOUNTER — Ambulatory Visit: Payer: 59 | Admitting: Gastroenterology

## 2023-01-08 ENCOUNTER — Encounter: Payer: Self-pay | Admitting: Gastroenterology

## 2023-01-08 ENCOUNTER — Telehealth: Payer: Self-pay

## 2023-01-08 VITALS — BP 120/84 | HR 69 | Temp 97.9°F | Ht <= 58 in | Wt 146.2 lb

## 2023-01-08 DIAGNOSIS — R933 Abnormal findings on diagnostic imaging of other parts of digestive tract: Secondary | ICD-10-CM

## 2023-01-08 DIAGNOSIS — R1031 Right lower quadrant pain: Secondary | ICD-10-CM

## 2023-01-08 DIAGNOSIS — R319 Hematuria, unspecified: Secondary | ICD-10-CM | POA: Diagnosis not present

## 2023-01-08 NOTE — Progress Notes (Signed)
Wyline Mood MD, MRCP(U.K) [address]  [address]  [place], [place] [address]  Main: [phone]  Fax: [phone]   Gastroenterology Consultation  Referring Provider:     No ref. provider found Primary Care Physician:  Patient, No Pcp Per Primary Gastroenterologist:  Dr. Wyline Mood  Reason for Consultation:     RLQ Pain - ER visit         HPI:   Charlotte Reynolds is a 37 y.o. y/o female  was seen at the ER on 11/17/2022 for RLQ pain of 2 days duration.   In the ER she had a CT scan of the abdomen that showed  New thickening of the right lateral conal fascia in the right lower quadrant adjacent to the cecum with subtle fat stranding and probable trace amount of fluid. There is also a new slightly prominent hyperattenuating pericecal lymph node measuring 6 x 8 mm. Findings are nonspecific; however, subacute/mild degree of underlying cecal inflammation can not be excluded on this exam.Also 3.1 cm right adnexa abnormality noted. ? Dominant follicle , ventral hernia  She had a USG pelvis also done : normal .   Hb 13 grams, CMP,Lipase- normal - moderate to large Hb in urine  Seen by GYN on 11/23/2022 : Plan was for GI evaluation before next steps from them.   She states that she used to live in [place] but moved to the [place] 5 years back.  She has had right lower quadrant pain ongoing for a few months not really changed.  The pain usually begins prior to a bowel movement usually after a meal and subsequently has diarrhea and relieved after the meal.  This only occurs when she eats food outside her home.  When she eats food cooked at home her bowel movements are formed.  Denies any NSAID use.  Denies any fevers night sweats.  Denies any family history of colon cancer, tuberculosis.  No blood in the stool.  Does have some pain in the same area during her.'s but no pain during sexual intercourse.  No other aggravating or relieving factors.  No past medical history on  file.  No past surgical history on file.  Prior to Admission medications   Medication Sig Start Date End Date Taking? Authorizing Provider  amitriptyline (ELAVIL) 25 MG tablet Take 25 mg by mouth at bedtime. 04/08/20   [provider]  dicyclomine (BENTYL) 10 MG capsule Take 1 capsule (10 mg total) by mouth 3 (three) times daily as needed (abdominal pain). 11/17/22   Phineas Semen, MD  dicyclomine (BENTYL) 20 MG tablet Take 20 mg by mouth 4 (four) times daily as needed for pain. 04/08/20   [provider]  HYDROcodone-acetaminophen (NORCO/VICODIN) 5-325 MG tablet Take 2 tablets by mouth every 6 (six) hours as needed for moderate pain or severe pain. 04/11/20   Loleta Rose, MD  ondansetron (ZOFRAN ODT) 4 MG disintegrating tablet Allow 1-2 tablets to dissolve in your mouth every 8 hours as needed for nausea/vomiting 04/11/20   Loleta Rose, MD  pantoprazole (PROTONIX) 40 MG tablet Take 1 tablet (40 mg total) by mouth daily for 15 days. 04/06/20 04/21/20  Gilles Chiquito, MD    No family history on file.   Social History   Tobacco Use   Smoking status: Never   Smokeless tobacco: Never  Vaping Use   Vaping status: Never Used  Substance Use Topics   Alcohol use: Never   Drug use: Never  Allergies as of 01/08/2023 - Review Complete 01/08/2023  Allergen Reaction Noted   Other Shortness Of Breath and Swelling 11/17/2022   Sulfa antibiotics Rash 04/06/2020    Review of Systems:    All systems reviewed and negative except where noted in HPI.   Physical Exam:  BP 120/84   Pulse 69   Temp 97.9 F (36.6 C) (Oral)   Ht 4\' 9"  (1.448 m)   Wt 146 lb 3.2 oz (66.3 kg)   BMI 31.64 kg/m  No LMP recorded. Psych:  Alert and cooperative. Normal mood and affect. General:   Alert,  Well-developed, well-nourished, pleasant and cooperative in NAD Head:  Normocephalic and atraumatic. Eyes:  Sclera clear, no icterus.   Conjunctiva pink. Ears:  Normal auditory acuity. Abdomen:   Normal bowel sounds.  No bruits.  Soft, very mild tenderness in the right lower quadrant and non-distended without masses, hepatosplenomegaly or hernias noted.  No guarding or rebound tenderness.    Neurologic:  Alert and oriented x3;  grossly normal neurologically. Psych:  Alert and cooperative. Normal mood and affect.  Imaging Studies: No results found.  Assessment and Plan:   Charlotte Reynolds is a 37 y.o. y/o female here after an ER visit for RLQ pain. CT showed non specific cecal inflammatory findings which may have been just from acute colitis from an infection vs longer stading issues such as infections from entaemoba/TB since she is from [place] , vs IBD. Also noted incidental finding of blood in the urine.   Plan  Recheck urine will be performed at her next visit since she is presently on her period CT scan of the abdomen to evaluate the status of the inflammation.  Depending on the results may need to consider MR enterography versus colonoscopy with ileal intubation and biopsies to rule out infection and inflammation.  Follow up in 6 weeks  Dr Wyline Mood MD,MRCP(U.K)  \

## 2023-01-08 NOTE — Telephone Encounter (Signed)
Patient CT scan was approved but she has been approved at [facility] in [place] not [facility]. Her insurance would not approved [facility] location. Can you please change location.   Summary of Your Request Please review the details of your request below and if everything looks correct click CONTINUE  Your case has been Approved. The prior authorization you submitted, Case [id number], has been received. Additional case status notifications will be sent if you opted in for email notifications. Thank you.   Provider Name: Wyline Mood Contact: [facility] Provider Address: [address] [place], [place] [address] Phone Number: [phone]  Fax Number: [phone] Patient Name: Charlotte Reynolds Patient Id: [id number] Insurance Carrier: [employer] Site Name: [facility] Site ID: [id number] Site Address: [address] [place], [place] [address]     Primary Diagnosis Code: R93.3 Description: Abnormal findings on diagnostic imaging of other parts of digestive tract Secondary Diagnosis Code: R10.31 Description: Right lower quadrant pain Date of Service: Not provided   CPT Code: 74177 Description: CT ABDOMEN & PELVIS W/ Authorization Number: [id number] Case Number: [id number] Review Date: 01/08/2023 4:13:20 PM Expiration Date: 07/07/2023 Status: Your case has been Approved. The prior authorization you submitted, Case [id number], has been received. Additional case status notifications will be sent if you opted in for email notifications. Thank you.

## 2023-01-08 NOTE — Patient Instructions (Addendum)
Please go to the [facility] [phone], [place], [place] [address]) Phone: [phone] on 01/12/2023 arrive at 12:15 PM. You are able to eat before your CT Scan. Please give central scheduling a call if you need to reschedule your appointment date or time at [phone].

## 2023-01-09 NOTE — Telephone Encounter (Signed)
Called central scheduling and cancelled patient's CT Scan scheduled at [facility] and faxed the order to [facility] so they could call the patient with an appointment date and time.

## 2023-01-12 ENCOUNTER — Ambulatory Visit
Admission: RE | Admit: 2023-01-12 | Discharge: 2023-01-12 | Disposition: A | Discharge status: Home / Self Care | Payer: 59 | Source: Ambulatory Visit | Attending: Gastroenterology | Admitting: Gastroenterology

## 2023-01-12 ENCOUNTER — Encounter: Payer: Self-pay | Admitting: Gastroenterology

## 2023-01-12 ENCOUNTER — Ambulatory Visit: Payer: 59

## 2023-01-12 DIAGNOSIS — R933 Abnormal findings on diagnostic imaging of other parts of digestive tract: Secondary | ICD-10-CM

## 2023-01-12 DIAGNOSIS — R1031 Right lower quadrant pain: Secondary | ICD-10-CM

## 2023-01-12 MED ORDER — IOPAMIDOL (ISOVUE-300) INJECTION 61%
100.0000 mL | Freq: Once | INTRAVENOUS | Status: AC | PRN
  Administered 2023-01-12: 100 mL via INTRAVENOUS

## 2023-01-22 ENCOUNTER — Telehealth: Payer: Self-pay

## 2023-01-22 NOTE — Telephone Encounter (Signed)
Patient was called and had to leave her a voicemail letting her know that her CT Scan was normal and that there's no expilantion as in why she was having the abdominal pain.

## 2023-01-22 NOTE — Telephone Encounter (Signed)
-----   Message from Wyline Mood sent at 01/22/2023  8:02 AM EST ----- No abnormality to explain abdominal pain

## 2023-03-12 ENCOUNTER — Ambulatory Visit: Payer: Self-pay | Admitting: Gastroenterology

## 2023-03-12 VITALS — BP 127/73 | HR 78 | Temp 98.1°F | Wt 147.6 lb

## 2023-03-12 DIAGNOSIS — R319 Hematuria, unspecified: Secondary | ICD-10-CM

## 2023-03-12 DIAGNOSIS — R152 Fecal urgency: Secondary | ICD-10-CM

## 2023-03-12 DIAGNOSIS — R1031 Right lower quadrant pain: Secondary | ICD-10-CM

## 2023-03-12 DIAGNOSIS — R8290 Unspecified abnormal findings in urine: Secondary | ICD-10-CM

## 2023-03-12 NOTE — Progress Notes (Signed)
Wyline Mood MD, MRCP(U.K) [address]  [address]  [place], [place] [address]  Main: [phone]  Fax: [phone]   Primary Care Physician: Patient, No Pcp Per  Primary Gastroenterologist:  Dr. Wyline Mood   Chief Complaint  Patient presents with   RLQ abdominal pain    HPI: Charlotte Reynolds is a 38 y.o. female  Summary of history :  Initially referred and seen on 01/08/2023 for RLQ pain . She was previously also seen at the ER on 11/17/2022 for RLQ pain of 2 days duration. CT scan of the abdomen that showed  New thickening of the right lateral conal fascia in the right lower quadrant adjacent to the cecum with subtle fat stranding and probable trace amount of fluid. There is also a new slightly prominent hyperattenuating pericecal lymph node measuring 6 x 8 mm. Findings are nonspecific; however, subacute/mild degree of underlying cecal inflammation can not be excluded on this exam.Also 3.1 cm right adnexa abnormality noted. ? Dominant follicle , ventral hernia   She had a USG pelvis also done : normal .    Hb 13 grams, CMP,Lipase- normal - moderate to large Hb in urine  Seen by GYN on 11/23/2022 : Plan was for GI evaluation before next steps from them.    She states that she used to live in [place] but moved to the [place] 5 years back.  She has had right lower quadrant pain ongoing for a few months not really changed.  The pain usually begins prior to a bowel movement usually after a meal and subsequently has diarrhea and relieved after the meal.  This only occurs when she eats food outside her home.  When she eats food cooked at home her bowel movements are formed.   Denies any NSAID use.  Denies any fevers night sweats.  Denies any family history of colon cancer, tuberculosis.  No blood in the stool.  Does have some pain in the same area during her.'s but no pain during sexual intercourse.  No other aggravating or relieving factors.    Interval history    01/08/2023-03/12/2023  01/21/2023: CT abdomen showed no acute abnormality  Since last visit all her right lower quadrant pain and symptoms have resolved.  Doing well.  No other complaints except some urgency when she eats with a bowel movement which she has had for many years.  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  No current outpatient medications on file.   No current facility-administered medications for this visit.    Allergies as of 03/12/2023 - Review Complete 03/12/2023  Allergen Reaction Noted   Other Shortness Of Breath and Swelling 11/17/2022   Sulfa antibiotics Rash 04/06/2020      ROS:  General: Negative for anorexia, weight loss, fever, chills, fatigue, weakness. ENT: Negative for hoarseness, difficulty swallowing , nasal congestion. CV: Negative for chest pain, angina, palpitations, dyspnea on exertion, peripheral edema.  Respiratory: Negative for dyspnea at rest, dyspnea on exertion, cough, sputum, wheezing.  GI: See history of present illness. GU:  Negative for dysuria, hematuria, urinary incontinence, urinary frequency, nocturnal urination.  Endo: Negative for unusual weight change.    Physical Examination:   BP 127/73   Pulse 78   Temp 98.1 F (36.7 C) (Oral)   Wt 147 lb 9.6 oz (67 kg)   BMI 31.94 kg/m   General: Well-nourished, well-developed in no acute  distress.  Eyes: No icterus. Conjunctivae pink. Abdomen: Bowel sounds are normal, nontender, nondistended, no hepatosplenomegaly or masses, no abdominal bruits or hernia , no rebound or guarding.   Extremities: No lower extremity edema. No clubbing or deformities. Neuro: Alert and oriented x 3.  Grossly intact. Skin: Warm and dry, no jaundice.   Psych: Alert and cooperative, normal mood and affect.   Imaging Studies: No results found.  Assessment and Plan:   Charlotte Reynolds is a 38 y.o. y/o female here to  follow up for RLQ pain. CT back in 10/2022 showed non specific cecal inflammatory findings which may have been just from acute colitis from an infection, repeat CT in 01/2024 showed a normal CT suggesting all findings had resolved.  Clinically all her discomfort in the right lower quadrant has also resolved.  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    Plan  Recheck urine will be performed at her next visit since she is presently on her period Vies her to decrease the amount of sugar content in her meals to see if the urgency with bowel movements results if not she has been advised to call our office and come back to see me to discuss further evaluation.    Dr Wyline Mood  MD,MRCP [facility]) Follow up in as needed

## 2023-03-13 ENCOUNTER — Telehealth: Payer: Self-pay

## 2023-03-13 DIAGNOSIS — R8290 Unspecified abnormal findings in urine: Secondary | ICD-10-CM

## 2023-03-13 LAB — URINALYSIS
Bilirubin, UA: NEGATIVE
Glucose, UA: NEGATIVE
Ketones, UA: NEGATIVE
Leukocytes,UA: NEGATIVE
Nitrite, UA: NEGATIVE
Protein,UA: NEGATIVE
Specific Gravity, UA: 1.02 (ref 1.005–1.030)
Urobilinogen, Ur: 0.2 mg/dL (ref 0.2–1.0)
pH, UA: 5.5 (ref 5.0–7.5)

## 2023-03-13 NOTE — Telephone Encounter (Signed)
Called patient and left her a detailed message letting her know that Dr. Tobi Bastos reviewed her urine report and that she is needing to be seen by an urologist. I let her know that they would be calling her to schedule an appointment. I also let her know that if she had further questions, to give me a call.

## 2023-03-13 NOTE — Telephone Encounter (Signed)
-----   Message from Wyline Mood sent at 03/13/2023  7:44 AM EST ----- Still has RBC in urine which she had previously - inform pcp and refer to urology

## 2023-05-09 ENCOUNTER — Ambulatory Visit: Payer: Self-pay | Admitting: Urology

## 2023-05-09 VITALS — BP 122/79 | HR 83 | Ht 62.0 in | Wt 140.0 lb

## 2023-05-09 DIAGNOSIS — R310 Gross hematuria: Secondary | ICD-10-CM

## 2023-05-09 DIAGNOSIS — R3129 Other microscopic hematuria: Secondary | ICD-10-CM

## 2023-05-09 NOTE — Progress Notes (Signed)
 I, Maysun Anabel Bene, acting as a scribe for Riki Altes, MD., have documented all relevant documentation on the behalf of Riki Altes, MD, as directed by Riki Altes, MD while in the presence of Riki Altes, MD.  05/09/2023 9:47 PM   Launa Flight Aniceto Boss [birth year] [id number]  Referring provider: Wyline Mood, MD [address] [place],  [place] [address]  Chief Complaint  Patient presents with   Hematuria    HPI: Charlotte Reynolds is a 38 y.o. female referred for evaluation of microhematuria.   Followed by GI for lower abdominal pain. AUA showed blood on dipstick, but no significant RBCs on microscopy.  She has mild urinary frequency, which is not bothersome. Non-smoker.  CT of the abdomen pelvis with contrast performed by GI November 2024 which showed normal appearing kidneys, without mass, calculi or hydronephrosis. No prior history of urologic problem  Denies gross hematuria.    Home Medications:  Allergies as of 05/09/2023       Reactions   Other Shortness Of Breath, Swelling   oranges   Sulfa Antibiotics Rash        Medication List    as of May 09, 2023  9:47 PM   You have not been prescribed any medications.     Allergies:  Allergies  Allergen Reactions   Other Shortness Of Breath and Swelling    oranges   Sulfa Antibiotics Rash    Social History:  reports that she has never smoked. She has never used smokeless tobacco. She reports that she does not drink alcohol and does not use drugs.   Physical Exam: BP 122/79   Pulse 83   Ht 5\' 2"  (1.575 m)   Wt 140 lb (63.5 kg)   BMI 25.61 kg/m   Constitutional:  Alert and oriented, No acute distress. HEENT: [place] AT, moist mucus membranes.  Trachea midline, no masses. Cardiovascular: No clubbing, cyanosis, or edema. Respiratory: Normal respiratory effort, no increased work of breathing. GI: Abdomen is soft, nontender, nondistended, no abdominal masses Skin: No  rashes, bruises or suspicious lesions. Neurologic: Grossly intact, no focal deficits, moving all 4 extremities. Psychiatric: Normal mood and affect.  Urinalysis Dipstick 2+ blood/trace leukocytes, microscopy 3-10 RBC.   Pertinent Imaging: CT was personally reviewed and interpreted.   CT EXAM: CT ABDOMEN AND PELVIS WITH CONTRAST   TECHNIQUE: Multidetector CT imaging of the abdomen and pelvis was performed using the standard protocol following bolus administration of intravenous contrast.   RADIATION DOSE REDUCTION: This exam was performed according to the departmental dose-optimization program which includes automated exposure control, adjustment of the mA and/or kV according to patient size and/or use of iterative reconstruction technique.   CONTRAST:  ISOVUE-300 IOPAMIDOL (ISOVUE-300) INJECTION 61%   COMPARISON:  Multiple priors including CT and ultrasound November 17, 2022.   FINDINGS: Lower chest: Hypoventilatory change in the lung bases.   Hepatobiliary: 16 mm bilobed subcapsular segment VI hepatic cysts. Additional scattered tiny hypodense hepatic lesions are technically too small to accurately characterize. Gallbladder is unremarkable. No biliary ductal dilation.   Pancreas: No pancreatic ductal dilation or evidence of acute inflammation.   Spleen: No splenomegaly.   Adrenals/Urinary Tract: Bilateral adrenal glands appear normal. No hydronephrosis. Kidneys demonstrate symmetric enhancement. Urinary bladder is unremarkable for degree of distension.   Stomach/Bowel: Radiopaque enteric contrast material traverses the rectum. Patulous distal esophagus. Stomach is unremarkable for degree of distension. No pathologic dilation of small or large bowel. Noninflamed  appendix. No evidence of acute bowel inflammation.   Vascular/Lymphatic: No significant vascular findings are present. No enlarged abdominal or pelvic lymph nodes.   Reproductive: Uterus and  bilateral adnexa are unremarkable.   Other: Trace pelvic free fluid is within physiologic normal limits.   Musculoskeletal: No acute or significant osseous findings.   IMPRESSION: 1. No acute abnormality in the abdomen or pelvis. 2. Patulous distal esophagus, which can be seen in the setting of esophageal dysmotility.     Electronically Signed   By: Maudry Mayhew M.D.   On: 01/21/2023 10:00     Assessment & Plan:    1. Microhumaturia AUA hematuria risk stratification: low.  She had upper tract imaging with a recent CT abdomen pelvis with contrast.  We discussed cystoscopy for lower tract evaluation which she desires to schedule.  I have reviewed the above documentation for accuracy and completeness, and I agree with the above.   Riki Altes, MD  [facility] [address] [place], [place] [address] [phone]

## 2023-05-10 ENCOUNTER — Encounter: Payer: Self-pay | Admitting: Urology

## 2023-05-10 LAB — URINALYSIS, COMPLETE
Bilirubin, UA: NEGATIVE
Glucose, UA: NEGATIVE
Leukocytes,UA: NEGATIVE
Nitrite, UA: NEGATIVE
Protein,UA: NEGATIVE
Specific Gravity, UA: 1.025 (ref 1.005–1.030)
Urobilinogen, Ur: 0.2 mg/dL (ref 0.2–1.0)
pH, UA: 7 (ref 5.0–7.5)

## 2023-05-10 LAB — MICROSCOPIC EXAMINATION
Bacteria, UA: NONE SEEN
WBC, UA: NONE SEEN /HPF (ref 0–5)

## 2023-06-06 ENCOUNTER — Ambulatory Visit: Payer: Self-pay | Admitting: Urology

## 2023-06-06 ENCOUNTER — Encounter: Payer: Self-pay | Admitting: Urology

## 2023-06-06 VITALS — BP 113/66 | HR 68 | Ht 61.0 in | Wt 141.0 lb

## 2023-06-06 DIAGNOSIS — R3129 Other microscopic hematuria: Secondary | ICD-10-CM

## 2023-06-06 LAB — URINALYSIS, COMPLETE
Bilirubin, UA: NEGATIVE
Glucose, UA: NEGATIVE
Ketones, UA: NEGATIVE
Leukocytes,UA: NEGATIVE
Nitrite, UA: NEGATIVE
Protein,UA: NEGATIVE
Specific Gravity, UA: 1.015 (ref 1.005–1.030)
Urobilinogen, Ur: 0.2 mg/dL (ref 0.2–1.0)
pH, UA: 8.5 — ABNORMAL HIGH (ref 5.0–7.5)

## 2023-06-06 LAB — MICROSCOPIC EXAMINATION

## 2023-06-06 MED ORDER — CEPHALEXIN 500 MG PO CAPS
500.0000 mg | ORAL_CAPSULE | Freq: Once | ORAL | Status: AC
Start: 2023-06-06 — End: 2023-06-06
  Administered 2023-06-06: 500 mg via ORAL

## 2023-06-06 NOTE — Progress Notes (Signed)
   06/06/23  CC:  Chief Complaint  Patient presents with   Cysto    HPI: Refer to my previous note 05/09/2023.  No complaints today.  UA dipstick 2+ blood/microscopy negative  Blood pressure 113/66, pulse 68, height 5\' 1"  (1.549 m), weight 141 lb (64 kg). NED. A&Ox3.   No respiratory distress   Abd soft, NT, ND Normal external genitalia with patent urethral meatus  Cystoscopy Procedure Note  Patient identification was confirmed, informed consent was obtained, and patient was prepped using Betadine solution.  Lidocaine jelly was administered per urethral meatus.    Procedure: - Flexible cystoscope introduced, without any difficulty.   - Thorough search of the bladder revealed:    normal urethral meatus    normal urothelium    no stones    no ulcers     no tumors    no urethral polyps    no trabeculation  - Ureteral orifices were normal in position and appearance.  Post-Procedure: - Patient tolerated the procedure well  Assessment/ Plan: No bladder mucosal abnormalities on cystoscopy UA today clear Follow-up 6 months with UA    Geraline Knapp, MD

## 2023-09-10 ENCOUNTER — Ambulatory Visit: Payer: 59 | Admitting: Gastroenterology

## 2023-09-14 ENCOUNTER — Encounter: Payer: Self-pay | Admitting: Urology

## 2023-12-04 ENCOUNTER — Ambulatory Visit: Admitting: Urology

## 2023-12-06 ENCOUNTER — Ambulatory Visit: Admitting: Urology

## 2023-12-11 ENCOUNTER — Ambulatory Visit: Admitting: Urology

## 2023-12-18 ENCOUNTER — Encounter: Payer: Self-pay | Admitting: Urology
